# Patient Record
Sex: Female | Born: 1995
Health system: Southern US, Community
[De-identification: ages and names within clinical notes are randomized; demographics above are authoritative.]

## PROBLEM LIST (undated history)

## (undated) DIAGNOSIS — D649 Anemia, unspecified: Secondary | ICD-10-CM

## (undated) DIAGNOSIS — R011 Cardiac murmur, unspecified: Secondary | ICD-10-CM

## (undated) DIAGNOSIS — T7840XA Allergy, unspecified, initial encounter: Secondary | ICD-10-CM

## (undated) HISTORY — DX: Allergy, unspecified, initial encounter: T78.40XA

---
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
## 2016-01-18 ENCOUNTER — Ambulatory Visit (INDEPENDENT_AMBULATORY_CARE_PROVIDER_SITE_OTHER): Payer: BLUE CROSS/BLUE SHIELD | Admitting: Physician Assistant

## 2016-01-18 VITALS — BP 118/74 | HR 70 | Temp 98.0°F | Resp 18 | Ht 64.0 in | Wt 123.0 lb

## 2016-01-18 DIAGNOSIS — J029 Acute pharyngitis, unspecified: Secondary | ICD-10-CM

## 2016-01-18 DIAGNOSIS — R591 Generalized enlarged lymph nodes: Secondary | ICD-10-CM

## 2016-01-18 LAB — POCT RAPID STREP A (OFFICE): RAPID STREP A SCREEN: NEGATIVE

## 2016-01-18 NOTE — Progress Notes (Signed)
   Levon HedgerChantell Whisonant  MRN: [id number] DOB: 01-19-1996  Subjective:  Pt presents to clinic with a 2 day h/o left side sore throat that seems to be getting worse.  She is not having any other cold symptoms.  She has tried salt water and throat spray but not gotten much relief.    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   No current outpatient prescriptions on file prior to visit.   No current facility-administered medications on file prior to visit.    Allergies  Allergen Reactions  . Penicillins Anaphylaxis    Review of Systems  Constitutional: Negative for fever and chills.  HENT: Positive for sore throat. Negative for congestion, postnasal drip and rhinorrhea.   Musculoskeletal: Negative for myalgias.   Objective:  BP 118/74 mmHg  Pulse 70  Temp(Src) 98 F (36.7 C) (Oral)  Resp 18  Ht 5\' 4"  (1.626 m)  Wt 123 lb (55.792 kg)  BMI 21.10 kg/m2  SpO2 98%  LMP 01/11/2016  Physical Exam  Constitutional: She is oriented to person, place, and time and well-developed, well-nourished, and in no distress.  HENT:  Head: Normocephalic and atraumatic.  Right Ear: Hearing, tympanic membrane, external ear and ear canal normal.  Left Ear: Hearing, tympanic membrane, external ear and ear canal normal.  Nose: Nose normal.  Mouth/Throat: Uvula is midline, oropharynx is clear and moist and mucous membranes are normal.  Eyes: Conjunctivae are normal.  Neck: Normal range of motion.  Cardiovascular: Normal rate, regular rhythm and normal heart sounds.   No murmur heard. Pulmonary/Chest: Effort normal and breath sounds normal.  Lymphadenopathy:    She has cervical adenopathy.       Right cervical: Superficial cervical (nontender) adenopathy present.       Left cervical: Superficial cervical adenopathy: tender.       Right: No supraclavicular adenopathy present.       Left: No supraclavicular adenopathy present.    Neurological: She is alert and oriented to person, place, and time. Gait normal.  Skin: Skin is warm and dry.  Psychiatric: Mood, memory, affect and judgment normal.  Vitals reviewed.  Results for orders placed or performed in visit on 01/18/16  POCT rapid strep A  Result Value Ref Range   Rapid Strep A Screen Negative Negative    Assessment and Plan :  Sore throat - Plan: POCT rapid strep A  Lymphadenopathy   Symptomatic care d/w pt.  Likely a virus causing PND and resulting lymphadenopathy.  Benny LennertSarah Weber PA-C  [facility] 01/18/2016 4:51 PM

## 2016-01-18 NOTE — Patient Instructions (Addendum)
Please use motrin or tylenol for the lymph node pain - if it does not change/resolve within about 2/4 weeks  Please RTC    IF you received an x-ray today, you will receive an invoice from [facility]. Please contact [facility] at [phone] with questions or concerns regarding your invoice.   IF you received labwork today, you will receive an invoice from [facility]. Please contact [facility] at [phone] with questions or concerns regarding your invoice.   Our billing staff will not be able to assist you with questions regarding bills from these companies.  You will be contacted with the lab results as soon as they are available. The fastest way to get your results is to activate your My Chart account. Instructions are located on the last page of this paperwork. If you have not heard from us regarding the results in 2 weeks, please contact this office.

## 2016-08-24 ENCOUNTER — Ambulatory Visit (INDEPENDENT_AMBULATORY_CARE_PROVIDER_SITE_OTHER): Payer: BLUE CROSS/BLUE SHIELD | Admitting: Physician Assistant

## 2016-08-24 VITALS — BP 118/64 | HR 77 | Temp 97.8°F | Ht 64.0 in | Wt 130.6 lb

## 2016-08-24 DIAGNOSIS — Z3A01 Less than 8 weeks gestation of pregnancy: Secondary | ICD-10-CM | POA: Diagnosis not present

## 2016-08-24 DIAGNOSIS — N912 Amenorrhea, unspecified: Secondary | ICD-10-CM

## 2016-08-24 LAB — POCT URINE PREGNANCY: PREG TEST UR: POSITIVE — AB

## 2016-08-24 NOTE — Patient Instructions (Addendum)
04/15/2017 - due date Your are 6 weeks and 6 days pregnant   OB[facility]  [place] - [phone] [facility] [phone] [facility] - [phone] [facility] - [phone]   Pregnancy care help for the uninsured [phone] [facility] ([facility] Health and Human services) LipstickBlog.huWww.ncdhhs.gov/medicaid  Alternatives to pregnancy: Adoption -  Adoption Network - [phone] - www.adoptionnetwork.[employer]  [facility] [facility] [phone]- [phone] - www.christianadopt.org  [employer] [phone]- [phone] - www.carolinaadoption.org  [facility] Child[facility] Hope - [phone] - www.achildshope.com  [facility] [phone]- [phone] - www.gsocarecenter.org Abortion -     A Preferred [facility] [phone]- [phone] - www.MobLag.com.cyapwhc.com - my favorite  A Woman's Choice - [phone] - www.http://www.smith-bell.org/awomanschoiceinc.com  Family Reproductive Health - [phone] - www.familyreproductive.com    IF you received an x-ray today, you will receive an invoice from [facility]. Please contact [facility] at [phone] with questions or concerns regarding your invoice.   IF you received labwork today, you will receive an invoice from [facility]. Please contact [facility] at [phone] with questions or concerns regarding your invoice.   Our billing staff will not be able to assist you with questions regarding bills from these companies.  You will be contacted with the lab results as soon as they are available. The fastest way to get your results is to activate your My Chart account. Instructions are located on the last page of this paperwork. If you have not heard from us regarding the results in 2 weeks, please contact this office.

## 2016-08-24 NOTE — Progress Notes (Signed)
   Shelly HedgerChantell Cantrell  MRN: [id number] DOB: [birth year]  Subjective:  Pt presents to clinic for a pregnancy test.  She took her nuvaring out 2/2 and never started her menses and her breast are more tender than normal.  It was time to put her nuvaring back in and because she had not bleed she took a home pregnancy test that was positive.  Sexually active - intermittent condom use with 1 partner nuvaring - took out 08/17/2016 LMP 07/09/2016 -   ETOh - no Drugs - no Smoke - no  Review of Systems  Gastrointestinal: Negative for nausea.    There are no active problems to display for this patient.   No current outpatient prescriptions on file prior to visit.   No current facility-administered medications on file prior to visit.     Allergies  Allergen Reactions  . Penicillins Anaphylaxis    Pt patients past, family and social history were reviewed and updated.   Objective:  BP 118/64 (BP Location: Right Arm, Patient Position: Sitting, Cuff Size: Small)   Pulse 77   Temp 97.8 F (36.6 C) (Oral)   Ht 5\' 4"  (1.626 m)   Wt 130 lb 9.6 oz (59.2 kg)   LMP 07/08/2016 (Approximate)   SpO2 100%   BMI 22.42 kg/m   Physical Exam  Constitutional: She is oriented to person, place, and time and well-developed, well-nourished, and in no distress.  HENT:  Head: Normocephalic and atraumatic.  Right Ear: Hearing and external ear normal.  Left Ear: Hearing and external ear normal.  Eyes: Conjunctivae are normal.  Neck: Normal range of motion.  Pulmonary/Chest: Effort normal.  Neurological: She is alert and oriented to person, place, and time. Gait normal.  Skin: Skin is warm and dry.  Psychiatric: Mood, memory, affect and judgment normal.  Vitals reviewed.   Results for orders placed or performed in visit on 08/24/16  POCT urine pregnancy  Result Value Ref Range   Preg Test, Ur Positive (A) Negative    Assessment and Plan :  Amenorrhea - Plan: POCT urine pregnancy  Less than [redacted]  weeks gestation of pregnancy - gave patient options for pregnancy - d/w her the time frame for her options - she should start a PNV while she is determining what she wants to do with the pregnancy.  Answered her questions.  Benny LennertSarah Trinty Marken PA-C  [facility] at [facility] 08/29/2016 11:30 AM

## 2016-08-29 ENCOUNTER — Encounter: Payer: Self-pay | Admitting: Physician Assistant

## 2017-07-20 ENCOUNTER — Encounter: Payer: Self-pay | Admitting: Family Medicine

## 2017-07-20 ENCOUNTER — Other Ambulatory Visit: Payer: Self-pay

## 2017-07-20 ENCOUNTER — Ambulatory Visit: Payer: BLUE CROSS/BLUE SHIELD | Admitting: Family Medicine

## 2017-07-20 VITALS — BP 120/74 | HR 78 | Temp 97.4°F | Ht 64.0 in | Wt 128.0 lb

## 2017-07-20 DIAGNOSIS — Z111 Encounter for screening for respiratory tuberculosis: Secondary | ICD-10-CM

## 2017-07-20 DIAGNOSIS — K5900 Constipation, unspecified: Secondary | ICD-10-CM

## 2017-07-20 DIAGNOSIS — Z23 Encounter for immunization: Secondary | ICD-10-CM | POA: Diagnosis not present

## 2017-07-20 DIAGNOSIS — R1011 Right upper quadrant pain: Secondary | ICD-10-CM

## 2017-07-20 DIAGNOSIS — R109 Unspecified abdominal pain: Secondary | ICD-10-CM

## 2017-07-20 LAB — POCT URINALYSIS DIP (MANUAL ENTRY)
Bilirubin, UA: NEGATIVE
Blood, UA: NEGATIVE
Glucose, UA: NEGATIVE mg/dL
Ketones, POC UA: NEGATIVE mg/dL
Leukocytes, UA: NEGATIVE
Nitrite, UA: NEGATIVE
Protein Ur, POC: NEGATIVE mg/dL
Spec Grav, UA: >=1.03 — AB (ref 1.010–1.025)
Urobilinogen, UA: 0.2 E.U./dL
pH, UA: 6.5 (ref 5.0–8.0)

## 2017-07-20 MED ORDER — TUBERCULIN PPD 5 UNIT/0.1ML ID SOLN
5.0000 [IU] | Freq: Once | INTRADERMAL | Status: AC
  Administered 2017-07-20: 5 [IU] via INTRADERMAL

## 2017-07-20 MED ORDER — POLYETHYLENE GLYCOL 3350 17 GM/SCOOP PO POWD
17.0000 g | Freq: Every day | ORAL | 1 refills | Status: DC
Start: 2017-07-20 — End: 2019-03-19

## 2017-07-20 MED ORDER — OMEPRAZOLE 20 MG PO CPDR: 20.0000 mg | DELAYED_RELEASE_CAPSULE | Freq: Every day | ORAL | 3 refills | Status: DC

## 2017-07-20 NOTE — Progress Notes (Signed)
1/5/201911:07 AM  Shelly Cantrell June 29, 1996, 22 y.o. female [id number]  Chief Complaint  Patient presents with  . Abdominal Pain    pain with breathing and lying down. Scale pain 4    HPI:   Patient is a 22 y.o. female with past medical history significant for constipation who presents today for 2 weeks of new onset worsening ruq abd pain, radiates to her back and epigastrum. Worse at night, when she lies down. She ate a bunch of bacon the other day and that also intensified it. She denies any nausea or vomiting, she does have mild bloating and significant gassiness. She is chronically constipated. Last BM 2 days, bristol stool type 1.   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 Denies h/o positive PPD test, exposures, recent travels, hemoptysis, cough, night sweats, weight loss.   Depression screen [facility] 07/20/2017 08/24/2016  Decreased Interest 0 0  Down, Depressed, Hopeless 0 0  PHQ - 2 Score 0 0    Allergies  Allergen Reactions  . Penicillins Anaphylaxis    Prior to Admission medications   Not on File    Past Medical History:  Diagnosis Date  . Allergy     History reviewed. No pertinent surgical history.  Social History   Tobacco Use  . Smoking status: Never Smoker  . Smokeless tobacco: Never Used  Substance Use Topics  . Alcohol use: No    Alcohol/week: 0.0 oz    Family History  Problem Relation Age of Onset  . Cancer Mother   . Diabetes Maternal Grandmother   . Diabetes Paternal Grandmother     ROS Per hpi No fever, chills No cough, SOB No dysuria, hematuria  OBJECTIVE:  Blood pressure 120/74, pulse 78, temperature (!) 97.4 F (36.3 C), temperature source Oral, height 5\' 4"  (1.626 m), weight 128 lb (58.1 kg), last menstrual period 07/07/2017, SpO2 100 %.  Physical Exam  Constitutional: She is oriented to person, place, and time and well-developed, well-nourished, and in no distress.  HENT:  Head: Normocephalic and  atraumatic.  Mouth/Throat: Oropharynx is clear and moist. No oropharyngeal exudate.  Eyes: EOM are normal. Pupils are equal, round, and reactive to light. No scleral icterus.  Neck: Neck supple.  Cardiovascular: Normal rate, regular rhythm and normal heart sounds. Exam reveals no gallop and no friction rub.  No murmur heard. Pulmonary/Chest: Effort normal and breath sounds normal. She has no wheezes. She has no rales.  Abdominal: Soft. Bowel sounds are normal. She exhibits no distension. There is no hepatosplenomegaly. There is tenderness in the right upper quadrant, epigastric area and left lower quadrant. There is no rebound, no guarding and negative Murphy's sign.  Musculoskeletal: She exhibits no edema.  Neurological: She is alert and oriented to person, place, and time. Gait normal.  Skin: Skin is warm and dry.     Results for orders placed or performed in visit on 07/20/17 (from the past 24 hour(s))  POCT urinalysis dipstick     Status: Abnormal   Collection Time: 07/20/17 11:03 AM  Result Value Ref Range   Color, UA yellow yellow   Clarity, UA clear clear   Glucose, UA negative negative mg/dL   Bilirubin, UA negative negative   Ketones, POC UA negative negative mg/dL   Spec Grav, UA >=4.098>=1.030 (A) 1.010 - 1.025   Blood, UA negative negative   pH, UA 6.5 5.0 - 8.0   Protein Ur, POC negative negative mg/dL   Urobilinogen, UA  0.2 0.2 or 1.0 E.U./dL   Nitrite, UA Negative Negative   Leukocytes, UA Negative Negative    ASSESSMENT and PLAN 1. Abdominal pain, unspecified abdominal location Discussed ddx. Will start with workup. ER precautions reviewed.  - POCT urinalysis dipstick - CBC with Differential/Platelet - Comprehensive metabolic panel  2. Need for prophylactic vaccination with combined diphtheria-tetanus-pertussis (DTP) vaccine - Td vaccine greater than or equal to 7yo preservative free IM  3. RUQ abdominal pain - CBC with Differential/Platelet - Comprehensive  metabolic panel - H. pylori breath test - US Abdomen Limited RUQ; Future  4. Screening-pulmonary TB Return in 2 days for read - tuberculin injection 5 Units  5. Constipation, unspecified constipation type  Other orders - omeprazole (PRILOSEC) 20 MG capsule; Take 1 capsule (20 mg total) by mouth daily. - polyethylene glycol powder (GLYCOLAX/MIRALAX) powder; Take 17 g by mouth daily.  Return 2 days for nurse PPD read.    Myles Lipps, MD Primary Care at [facility] [address] [place], [place] [address] Ph.  [phone] Fax [phone]

## 2017-07-20 NOTE — Patient Instructions (Addendum)
IF you received an x-ray today, you will receive an invoice from [facility]. Please contact [facility] at [phone] with questions or concerns regarding your invoice.   IF you received labwork today, you will receive an invoice from [facility]. Please contact [facility] at [phone] with questions or concerns regarding your invoice.   Our billing staff will not be able to assist you with questions regarding bills from these companies.  You will be contacted with the lab results as soon as they are available. The fastest way to get your results is to activate your My Chart account. Instructions are located on the last page of this paperwork. If you have not heard from [place] regarding the results in 2 weeks, please contact this office.     Tuberculosis Risk Questionnaire  1. No Were you born outside the [place] in one of the following parts of the world: [place], [place], [place], [place] or [place]?    2. No Have you traveled outside the [place] and lived for more than one month in one of the following parts of the world: [place], [place], [place], [place] or [place]?    3. No Do you have a compromised immune system such as from any of the following conditions:HIV/AIDS, organ or bone marrow transplantation, diabetes, immunosuppressive medicines (e.g. Prednisone, Remicaide), leukemia, lymphoma, cancer of the head or neck, gastrectomy or jejunal bypass, end-stage renal disease (on dialysis), or silicosis?     4. 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 No Have you ever: injected illegal drugs, used crack cocaine, lived in a homeless shelter  or been in jail or prison?     6. No Have you ever been exposed to anyone with infectious tuberculosis?  7. No Have you ever had a BCG vaccine? (BCG is a vaccine for tuberculosis   (TB) used in OTHER countries, NOT in the [place]).  8. No Have you ever been advised by a health care provider NOT to have a TB skin test?  9. No Have you ever had a POSITIVE TB skin test?  IF SO, when? n/a  IF SO, were you treated with INH? n/a  IF SO, where? n/a  Tuberculosis Symptom Questionnaire  Do you currently have any of the following symptoms?  1. No Unexplained cough lasting more than 3 weeks?   2. No Unexplained fever lasting more than 3 weeks.   3. No Night Sweats (sweating that leaves the bedclothes and sheets wet)     4. No Shortness of Breath   5. No Chest Pain   6. No Unintentional weight loss    7. No Unexplained fatigue (very tired for no reason)    Biliary Colic, Adult Biliary colic is severe pain caused by a problem with a small organ in the upper right part of your belly (gallbladder). The gallbladder stores a digestive fluid produced in the liver (bile) that helps the body break down fat. Bile and other digestive enzymes are carried from the liver to the small intestine though tube-like structures (bile ducts). The gallbladder and the bile ducts form the biliary tract. Sometimes hard deposits of digestive fluids form in the gallbladder (gallstones) and block the flow of bile from the gallbladder, causing biliary colic. This condition is also called a gallbladder attack. Gallstones can be as small  as a grain of sand or as big as a golf ball. There could be just one gallstone in the gallbladder, or there could be many. What are the causes? Biliary colic is usually caused by gallstones. Less often, a tumor could block the flow of bile from the gallbladder and trigger biliary colic. What increases the risk? This condition is more likely to develop in:  Women.  People of Hispanic descent.  People with a family history of gallstones.  People who are obese.  People who suddenly or quickly lose weight.  People who eat a high-calorie, low-fiber diet that is rich  in refined carbs (carbohydrates), such as white bread and white rice.  People who have an intestinal disease that affects nutrient absorption, such as Crohn disease.  People who have a metabolic condition, such as metabolic syndrome or diabetes.  What are the signs or symptoms? Severe pain in the upper right side of the belly is the main symptom of biliary colic. You may feel this pain below the chest but above the hip. This pain often occurs at night or after eating a very fatty meal. This pain may get worse for up to an hour and last as long as 12 hours. In most cases, the pain fades (subsides) within a couple hours. Other symptoms of this condition include:  Nausea and vomiting.  Pain under the right shoulder.  How is this diagnosed? This condition is diagnosed based on your medical history, your symptoms, and a physical exam. You may have tests, including:  Blood tests to rule out infection or inflammation of the bile ducts, gallbladder, pancreas, or liver.  Imaging studies such as: ? Ultrasound. ? CT scan. ? MRI.  In some cases, you may need to have an imaging study done using a small amount of radioactive material (nuclear medicine) to confirm the diagnosis. How is this treated? Treatment for this condition may include medicine to relieve your pain or nausea. If you have gallstones that are causing biliary colic, you may need surgery to remove the gallbladder (cholecystectomy). Gallstones can also be dissolved gradually with medicine. It may take months or years before the gallstones are completely gone. Follow these instructions at home:  Take over-the-counter and prescription medicines only as told by your health care provider.  Drink enough fluid to keep your urine clear or pale yellow.  Follow instructions from your health care provider about eating or drinking restrictions. These may include avoiding: ? Fatty, greasy, and fried foods. ? Any foods that make the pain  worse. ? Overeating. ? Having a large meal after not eating for a while.  Keep all follow-up visits as told by your health care provider. This is important. How is this prevented? Steps to prevent this condition include:  Maintaining a healthy body weight.  Getting regular exercise.  Eating a healthy, high-fiber, low-fat diet.  Limiting how much sugar and refined carbs you eat, such as sweets, white flour, and white rice.  Contact a health care provider if:  Your pain lasts more than 5 hours.  You vomit.  You have a fever and chills.  Your pain gets worse. Get help right away if:  Your skin or the whites of your eyes look yellow (jaundice).  Your have tea-colored urine and light-colored stools.  You are dizzy or you faint. This information is not intended to replace advice given to you by your health care provider. Make sure you discuss any questions you have with your health care provider.  Document Released: 12/03/2005 Document Revised: 02/28/2016 Document Reviewed: 01/16/2016 Elsevier Interactive Patient Education  [employer].

## 2017-07-21 LAB — COMPREHENSIVE METABOLIC PANEL
ALT: 12 IU/L (ref 0–32)
AST: 15 IU/L (ref 0–40)
Albumin/Globulin Ratio: 1.4 (ref 1.2–2.2)
Albumin: 4.5 g/dL (ref 3.5–5.5)
Alkaline Phosphatase: 62 IU/L (ref 39–117)
BUN/Creatinine Ratio: 13 (ref 9–23)
BUN: 11 mg/dL (ref 6–20)
Bilirubin Total: 0.3 mg/dL (ref 0.0–1.2)
CO2: 23 mmol/L (ref 20–29)
Calcium: 9.5 mg/dL (ref 8.7–10.2)
Chloride: 103 mmol/L (ref 96–106)
Creatinine, Ser: 0.85 mg/dL (ref 0.57–1.00)
GFR calc Af Amer: 112 mL/min/{1.73_m2} (ref >59)
GFR calc non Af Amer: 98 mL/min/{1.73_m2} (ref >59)
Globulin, Total: 3.2 g/dL (ref 1.5–4.5)
Glucose: 85 mg/dL (ref 65–99)
Potassium: 4.2 mmol/L (ref 3.5–5.2)
Sodium: 143 mmol/L (ref 134–144)
Total Protein: 7.7 g/dL (ref 6.0–8.5)

## 2017-07-21 LAB — CBC WITH DIFFERENTIAL/PLATELET
Basophils Absolute: 0 10*3/uL (ref 0.0–0.2)
Basos: 0 %
EOS (ABSOLUTE): 0.3 10*3/uL (ref 0.0–0.4)
Eos: 7 %
Hematocrit: 34.1 % (ref 34.0–46.6)
Hemoglobin: 11.3 g/dL (ref 11.1–15.9)
Immature Grans (Abs): 0 10*3/uL (ref 0.0–0.1)
Immature Granulocytes: 0 %
Lymphocytes Absolute: 1.8 10*3/uL (ref 0.7–3.1)
Lymphs: 35 %
MCH: 27 pg (ref 26.6–33.0)
MCHC: 33.1 g/dL (ref 31.5–35.7)
MCV: 81 fL (ref 79–97)
Monocytes Absolute: 0.3 10*3/uL (ref 0.1–0.9)
Monocytes: 6 %
Neutrophils Absolute: 2.7 10*3/uL (ref 1.4–7.0)
Neutrophils: 52 %
Platelets: 514 10*3/uL — ABNORMAL HIGH (ref 150–379)
RBC: 4.19 x10E6/uL (ref 3.77–5.28)
RDW: 13.5 % (ref 12.3–15.4)
WBC: 5.1 10*3/uL (ref 3.4–10.8)

## 2017-07-22 ENCOUNTER — Ambulatory Visit: Payer: BLUE CROSS/BLUE SHIELD | Admitting: Physician Assistant

## 2017-07-23 LAB — H. PYLORI BREATH TEST: H pylori Breath Test: NEGATIVE

## 2017-07-25 ENCOUNTER — Emergency Department (HOSPITAL_COMMUNITY)
Admission: EM | Admit: 2017-07-25 | Discharge: 2017-07-25 | Disposition: A | Discharge status: Home / Self Care | Payer: BLUE CROSS/BLUE SHIELD | Attending: Emergency Medicine | Admitting: Emergency Medicine

## 2017-07-25 ENCOUNTER — Emergency Department (HOSPITAL_COMMUNITY): Payer: BLUE CROSS/BLUE SHIELD

## 2017-07-25 ENCOUNTER — Other Ambulatory Visit: Payer: Self-pay

## 2017-07-25 ENCOUNTER — Encounter (HOSPITAL_COMMUNITY): Payer: Self-pay

## 2017-07-25 DIAGNOSIS — Y92410 Unspecified street and highway as the place of occurrence of the external cause: Secondary | ICD-10-CM | POA: Insufficient documentation

## 2017-07-25 DIAGNOSIS — R079 Chest pain, unspecified: Secondary | ICD-10-CM | POA: Diagnosis present

## 2017-07-25 DIAGNOSIS — Z79899 Other long term (current) drug therapy: Secondary | ICD-10-CM | POA: Diagnosis not present

## 2017-07-25 DIAGNOSIS — Y999 Unspecified external cause status: Secondary | ICD-10-CM | POA: Insufficient documentation

## 2017-07-25 DIAGNOSIS — Y9389 Activity, other specified: Secondary | ICD-10-CM | POA: Insufficient documentation

## 2017-07-25 DIAGNOSIS — R0789 Other chest pain: Secondary | ICD-10-CM | POA: Diagnosis not present

## 2017-07-25 MED ORDER — IBUPROFEN 400 MG PO TABS: 400.0000 mg | ORAL_TABLET | Freq: Three times a day (TID) | ORAL | 0 refills | Status: DC

## 2017-07-25 MED ORDER — IBUPROFEN 400 MG PO TABS
400.0000 mg | ORAL_TABLET | Freq: Once | ORAL | Status: AC
  Administered 2017-07-25: 400 mg via ORAL
  Filled 2017-07-25: qty 1

## 2017-07-25 NOTE — ED Notes (Signed)
Pt departed in NAD, refused use of wheelchair.  
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
## 2017-08-01 ENCOUNTER — Encounter: Payer: Self-pay | Admitting: Family Medicine

## 2017-08-01 ENCOUNTER — Ambulatory Visit: Payer: BLUE CROSS/BLUE SHIELD | Admitting: Family Medicine

## 2017-08-01 VITALS — BP 108/68 | HR 88 | Temp 98.8°F | Resp 18 | Ht 64.0 in | Wt 125.8 lb

## 2017-08-01 DIAGNOSIS — M542 Cervicalgia: Secondary | ICD-10-CM | POA: Diagnosis not present

## 2017-08-01 DIAGNOSIS — S060X0A Concussion without loss of consciousness, initial encounter: Secondary | ICD-10-CM | POA: Diagnosis not present

## 2017-08-01 MED ORDER — CYCLOBENZAPRINE HCL 10 MG PO TABS: 10.0000 mg | ORAL_TABLET | Freq: Three times a day (TID) | ORAL | 0 refills | Status: DC | PRN

## 2017-08-01 NOTE — Patient Instructions (Addendum)
   IF you received an x-ray today, you will receive an invoice from [facility]. Please contact [facility] at [phone] with questions or concerns regarding your invoice.   IF you received labwork today, you will receive an invoice from [facility]. Please contact [facility] at [phone] with questions or concerns regarding your invoice.   Our billing staff will not be able to assist you with questions regarding bills from these companies.  You will be contacted with the lab results as soon as they are available. The fastest way to get your results is to activate your My Chart account. Instructions are located on the last page of this paperwork. If you have not heard from us regarding the results in 2 weeks, please contact this office.     Concussion, Adult A concussion is a brain injury from a direct hit (blow) to the head or body. This blow causes the brain to shake quickly back and forth inside the skull. This can damage brain cells and cause chemical changes in the brain. A concussion may also be known as a mild traumatic brain injury (TBI). Concussions are usually not life-threatening, but the effects of a concussion can be serious. If you have a concussion, you are more likely to experience concussion-like symptoms after a direct blow to the head in the future. What are the causes? This condition is caused by:  A direct blow to the head, such as from running into another player during a game, being hit in a fight, or hitting your head on a hard surface.  A jolt of the head or neck that causes the brain to move back and forth inside the skull, such as in a car crash.  What are the signs or symptoms? The signs of a concussion can be hard to notice. Early on, they may be missed by you, family members, and health care providers. You may look fine but act or feel differently. Symptoms are usually temporary, but they may last for days, weeks, or even longer. Some  symptoms may appear right away but other symptoms may not show up for hours or days. Every head injury is different. Symptoms may include:  Headaches. This can include a feeling of pressure in the head.  Memory problems.  Trouble concentrating, organizing, or making decisions.  Slowness in thinking, acting or reacting, speaking, or reading.  Confusion.  Fatigue.  Changes in eating or sleeping patterns.  Problems with coordination or balance.  Nausea or vomiting.  Numbness or tingling.  Sensitivity to light or noise.  Vision or hearing problems.  Reduced sense of smell.  Irritability or mood changes.  Dizziness.  Lack of motivation.  Seeing or hearing things that other people do not see or hear (hallucinations).  How is this diagnosed? This condition is diagnosed based on:  Your symptoms.  A description of your injury.  You may also have tests, including:  Imaging tests, such as a CT scan or MRI. These are done to look for signs of brain injury.  Neuropsychological tests. These measure your thinking, understanding, learning, and remembering abilities.  How is this treated? This condition is treated with physical and mental rest and careful observation, usually at home. If the concussion is severe, you may need to stay home from work for a while. You may be referred to a concussion clinic or to other health care providers for management. It is important that you tell your health care provider if:  You are taking any medicines,   including prescription medicines, over-the-counter medicines, and natural remedies. Some medicines, such as blood thinners (anticoagulants) and aspirin, may increase the chance of complications, such as bleeding.  You are taking or have taken alcohol or illegal drugs. Alcohol and certain other drugs may slow your recovery and can put you at risk of further injury.  How fast you will recover from a concussion depends on many factors, such as  how severe your concussion is, what part of your brain was injured, how old you are, and how healthy you were before the concussion. Recovery can take time. It is important to wait to return to activity until a health care provider says it is safe to do that and your symptoms are completely gone. Follow these instructions at home: Activity  Limit activities that require a lot of thought or concentration. These may include: ? Doing homework or job-related work. ? Watching TV. ? Working on the computer. ? Playing memory games and puzzles.  Rest. Rest helps the brain to heal. Make sure you: ? Get plenty of sleep at night. Avoid staying up late at night. ? Keep the same bedtime hours on weekends and weekdays. ? Rest during the day. Take naps or rest breaks when you feel tired.  Having another concussion before the first one has healed can be dangerous. Do not do high-risk activities that could cause a second concussion, such as riding a bicycle or playing sports.  Ask your health care provider when you can return to your normal activities, such as school, work, athletics, driving, riding a bicycle, or using heavy machinery. Your ability to react may be slower after a brain injury. Never do these activities if you are dizzy. Your health care provider will likely give you a plan for gradually returning to activities. General instructions  Take over-the-counter and prescription medicines only as told by your health care provider.  Do not drink alcohol until your health care provider says you can.  If it is harder than usual to remember things, write them down.  If you are easily distracted, try to do one thing at a time. For example, do not try to watch TV while fixing dinner.  Talk with family members or close friends when making important decisions.  Watch your symptoms and tell others to do the same. Complications sometimes occur after a concussion. Older adults with a brain injury may have  a higher risk of serious complications, such as a blood clot in the brain.  Tell your teachers, school nurse, school counselor, coach, athletic trainer, or work manager about your injury, symptoms, and restrictions. Tell them about what you can or cannot do. They should watch for: ? Increased problems with attention or concentration. ? Increased difficulty remembering or learning new information. ? Increased time needed to complete tasks or assignments. ? Increased irritability or decreased ability to cope with stress. ? Increased symptoms.  Keep all follow-up visits as told by your health care provider. This is important. How is this prevented? It is very important to avoid another brain injury, especially as you recover. In rare cases, another injury can lead to permanent brain damage, brain swelling, or death. The risk of this is greatest during the first 7-10 days after a head injury. Avoid injuries by:  Wearing a seat belt when riding in a car.  Wearing a helmet when biking, skiing, skateboarding, skating, or doing similar activities.  Avoiding activities that could lead to a second concussion, such as contact or recreational sports,   until your health care provider says it is okay.  Taking safety measures in your home, such as: ? Removing clutter and tripping hazards from floors and stairways. ? Using grab bars in bathrooms and handrails by stairs. ? Placing non-slip mats on floors and in bathtubs. ? Improving lighting in dim areas.  Contact a health care provider if:  Your symptoms get worse.  You have new symptoms.  You continue to have symptoms for more than 2 weeks. Get help right away if:  You have severe or worsening headaches.  You have weakness or numbness in any part of your body.  Your coordination gets worse.  You vomit repeatedly.  You are sleepier.  The pupil of one eye is larger than the other.  You have convulsions or a seizure.  Your speech is  slurred.  Your fatigue, confusion, or irritability gets worse.  You cannot recognize people or places.  You have neck pain.  It is difficult to wake you up.  You have unusual behavior changes.  You lose consciousness. Summary  A concussion is a brain injury from a direct hit (blow) to the head or body.  A concussion may also be called a mild traumatic brain injury (TBI).  You may have imaging tests and neuropsychological tests to diagnose a concussion.  This condition is treated with physical and mental rest and careful observation.  Ask your health care provider when you can return to your normal activities, such as school, work, athletics, driving, riding a bicycle, or using heavy machinery. Follow safety instructions as told by your health care provider. This information is not intended to replace advice given to you by your health care provider. Make sure you discuss any questions you have with your health care provider. Document Released: 09/22/2003 Document Revised: 06/12/2016 Document Reviewed: 06/12/2016 Elsevier Interactive Patient Education  2018 Elsevier Inc.  

## 2017-08-02 ENCOUNTER — Encounter: Payer: Self-pay | Admitting: Family Medicine
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
## 2017-08-05 ENCOUNTER — Ambulatory Visit
Admission: RE | Admit: 2017-08-05 | Discharge: 2017-08-05 | Disposition: A | Discharge status: Home / Self Care | Payer: BLUE CROSS/BLUE SHIELD | Source: Ambulatory Visit | Attending: Family Medicine | Admitting: Family Medicine

## 2017-08-05 DIAGNOSIS — R1011 Right upper quadrant pain: Secondary | ICD-10-CM

## 2017-08-08 ENCOUNTER — Encounter: Payer: Self-pay | Admitting: Family Medicine

## 2017-08-08 ENCOUNTER — Other Ambulatory Visit: Payer: Self-pay

## 2017-08-08 ENCOUNTER — Ambulatory Visit: Payer: BLUE CROSS/BLUE SHIELD | Admitting: Family Medicine

## 2017-08-08 VITALS — BP 116/68 | HR 76 | Temp 98.7°F | Ht 65.0 in | Wt 127.0 lb

## 2017-08-08 DIAGNOSIS — F430 Acute stress reaction: Secondary | ICD-10-CM

## 2017-08-08 DIAGNOSIS — S060X0D Concussion without loss of consciousness, subsequent encounter: Secondary | ICD-10-CM | POA: Diagnosis not present
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
## 2017-08-08 NOTE — Patient Instructions (Signed)
     IF you received an x-ray today, you will receive an invoice from [facility]. Please contact [facility] at [phone] with questions or concerns regarding your invoice.   IF you received labwork today, you will receive an invoice from [facility]. Please contact [facility] at [phone] with questions or concerns regarding your invoice.   Our billing staff will not be able to assist you with questions regarding bills from these companies.  You will be contacted with the lab results as soon as they are available. The fastest way to get your results is to activate your My Chart account. Instructions are located on the last page of this paperwork. If you have not heard from us regarding the results in 2 weeks, please contact this office.     

## 2017-08-13 ENCOUNTER — Telehealth: Payer: Self-pay
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
## 2017-08-14 ENCOUNTER — Encounter: Payer: Self-pay | Admitting: Family Medicine

## 2017-08-14 ENCOUNTER — Ambulatory Visit (INDEPENDENT_AMBULATORY_CARE_PROVIDER_SITE_OTHER): Payer: BLUE CROSS/BLUE SHIELD | Admitting: Family Medicine

## 2017-08-14 VITALS — BP 116/76 | HR 44 | Ht 64.0 in | Wt 127.0 lb

## 2017-08-14 DIAGNOSIS — S060X0A Concussion without loss of consciousness, initial encounter: Secondary | ICD-10-CM | POA: Diagnosis not present

## 2017-08-14 NOTE — Patient Instructions (Signed)
Good to see you  Likely concussion but really almost gone Fish oil 3 grams daily for 10 days then 2 grams daily thereafter CoQ10 400mg  daily until headaches resolved.  Vitamin D 2000 IU daily  We will get you back to track in a bout a week  Make an appointment in 1 week if not perfect then see me

## 2017-08-19 ENCOUNTER — Telehealth: Payer: Self-pay | Admitting: Family Medicine

## 2017-08-19 NOTE — Telephone Encounter (Signed)
Called Dr. Marikay Alaralorisha isreal and left a message on how to proceed with a referral for her.. Told her to call back

## 2017-08-20 NOTE — Progress Notes (Deleted)
Subjective:   @VITALSMCOMMENTS @  Chief Complaint: Shelly Cantrell, DOB: 08-Oct-1995, is a 22 y.o. female who presents for No chief complaint on file.   Injury date : *** Visit #: ***  History of Present Illness:   Patient's goals/priorities: ***  CLASS CURRENT GRADE COMMENTS                               Concussion Self-Reported Symptom Score Symptoms rated on a scale 1-6, in last 24 hours  Headache: ***    Nausea: ***  Vomiting: ***  Balance Difficulty: ***   Dizziness: ***  Fatigue: ***  Trouble Falling Asleep: ***   Sleep More Than Usual: ***  Sleep Less Than Usual: ***  Daytime Drowsiness: ***  Photophobia: ***  Phonophobia: ***  Feeling anxious: ***  Confused: ***  Irritability: ***  Sadness: ***  Nervousness: ***  Feeling More Emotional: ***  Numbness or Tingling: ***  Feeling Slowed Down: ***  Feeling Mentally Foggy: ***  Difficulty Concentrating: ***  Difficulty Remembering: ***  Visual Problems: ***  Neck Pain: ***  Tinnitus: ***   Total Symptom Score: *** Previous Symptom Score: ***  Review of Systems: Pertinent items are noted in HPI.  Review of History: Past Medical History: @PMHP @  Past Surgical History:  has no past surgical history on file. Family History: family history includes Cancer in her mother; Diabetes in her maternal grandmother and paternal grandmother. Social History:  reports that  has never smoked. she has never used smokeless tobacco. She reports that she does not drink alcohol or use drugs. Current Medications: has a current medication list which includes the following prescription(s): ibuprofen and polyethylene glycol powder. Allergies: is allergic to penicillins.  Objective:    Physical Examination There were no vitals filed for this visit. General appearance: alert, appears stated age and cooperative Head: Normocephalic, without obvious abnormality, atraumatic Eyes: conjunctivae/corneas clear. PERRL, EOM's intact.  Fundi benign. Sclera anicteric. Lungs: clear to auscultation bilaterally and percussion Heart: regular rate and rhythm, S1, S2 normal, no murmur, click, rub or gallop Neurologic: CN 2-12 normal.  Sensation to pain, touch, and proprioception normal.  DTRs  normal in upper and lower extremities. No pathologic reflexes. Neg rhomberg, modified rhomberg, pronator drift, tandem gait, finger-to-nose; see post-concussion vestibular and oculomotor testing in chart Psychiatric: Oriented X3, intact recent and remote memory, judgement and insight, normal mood and affect  Concussion testing performed today:  Neurocognitive testing (ImPACT):  Baseline:*** Post #1: ***   Verbal Memory Composite *** (***%) *** (***%)   Visual Memory Composite *** (***%) *** (***%)   Visual Motor Speed Composite *** (***%) *** (***%)   Reaction Time Composite *** (***%) *** (***%)   Cognitive Efficiency Index *** ***     Vestibular Screening:   Pre VOMS  HA Score: *** Pre VOMS  Dizziness Score: ***   Headache  Dizziness  Smooth Pursuits *** ***  H. Saccades *** ***  V. Saccades *** ***  H. VOR *** ***  V. VOR *** ***  Visual Motor Sensitivity *** ***  Accommodation Right: *** cm Left: *** cm *** ***  Convergence: *** cm Divergence: *** cm *** ***   Balance Screen: ***  Additional testing performed today: { :28529}   Assessment:    No diagnosis found.  Shelly Cantrell presents with the following concussion subtypes. [] Cognitive [] Cervical [] Vestibular [] Ocular [] Migraine [] Anxiety/Mood   Plan:   Action/Discussion: Reviewed diagnosis, management options, expected outcomes,  and the reasons for scheduled and emergent follow-up. Questions were adequately answered. Patient expressed verbal understanding and agreement with the following plan.      Participation in school/work: Patient is cleared to return to work/school and activities of daily living without restrictions.  Patient is not cleared to  return to work/school until further notice.  Patient may return to work/school on ***, with the following restrictions/supports:  Length of Day:  Please allow patient to use *** class as study hall in a quiet area.  Shortened school/work day: Recommend *** until ***.  Recommend core classes only.  Extra Time:  Take mental rest breaks during the day as needed. Check for return of symptoms when participating in any activities that require a significant amount of attention or concentration.  Allow extra time to complete tasks.  Please allow *** weeks to make up missed assignments, test, quizzes.  Visual/Vestibular Accommodations in School:  Allow patient to eat lunch in quiet environment with 1-2 classmates.  Allow patient to leave class 5 minutes before end of period to avoid busy/noisy hallway.  Please provide any supplemental learning materials (power points, lecture notes, handouts, etc) in minimum size 18 font and allow/provide any auditory supplements to learning when possible (books on tape, audio tape lectures, etc) to limit visual stress in the classroom.  Patient is cleared for auditory participation only. Patient is not cleared for homework, quizzes, or tests at this time.   Testing:  May begin taking tests/quizzes on *** with no more than one test/quiz per day.   No significant classroom or standardized testing until ***.  Home/Extracurricular:  Lessen work/homework load to allow adequate cognitive rest. Work *** minutes with intervals of *** minute breaks (total *** hours).  Limit visual stimulants including: driving, watching television/movies, reading, using cell phone, etc. - to ensure relative visual cognitive rest. NOT cleared for video or phone games. May participate *** minutes with intervals of *** minute breaks (total *** hours).   Participation in physical activity: Patient is cleared to return to physical activity participation without  restrictions.  Patient is not cleared for formal physical activity (includes physical education class, sports practices, sports games, weight training, etc) at this time.   However, we recommend that patient has 20-30 minutes of light cardiovascular activity daily, with NO risk of head injury (example: walking), staying below level of symptoms.  Recommend gradual progression to *** vestibular exercise (30-45 min/day) with no risk of head trauma while staying below level of symptoms.  See your exercise treatment menu for details.   Begin your exercise prescription on ____________ (see separate exercise prescription form)  Cleared for physical activity that poses NO RISK of head trauma.   Gradual return to physical activity under the supervision of a physician and/or athletic trainer  Once asymptomatic for 24 hours, patient may start Stage *** of CFPSM's { :10024} Exercise Progression Protocol. This is to be monitored by patient's { :28383}    Patient may start Stage *** of CFPSM's { :10024} Exercise Progression Protocol. This is to be monitored by patient's { :28383}.   Patient is not cleared for full contact activities, activities with risk of head trauma or unsupervised physical activity while participating in CFPSM's Exercise Progression. Check for return of symptoms (using Concussion Education Form Symptom List) when participating in activity and 24 hours following. If symptoms return, patient to contact our office for further recommendations.    [facility] Medical Recommendations form has been given to patient allowing *** to progress  patient back into full participation.  Active Treatment Strategies:  Fueling your brain is important for recovery. It is essential to stay well hydrated, aiming for half of your body weight in fluid ounces per day (100 lbs = 50 oz). We also recommend eating breakfast to start your day and focus on a well-balanced diet containing lean protein, 'good' fats, and  complex carbohydrates. See your nutrition / hydration handout for more details.   Quality sleep is vital in your concussion recovery. We encourage lots of sleep for the first 24-72 hours after injury but following this period it is important to regulate your sleep cycle. We encourage *** hours of quality sleep per night. See your sleep handout for more details and strategies to quality sleep.  IF NOT USING THE OPTIONS BELOW DELETE THEM  Treating your vestibular and visual dysfunction will decrease your recovery time and improve your symptoms. Begin your home vestibular exercise program as directed on your AVS.    Begin taking Amantadine medicine as directed.   Begin taking DHA supplement as directed.    Begin home exercise program for neck as directed.   Follow-up information:  Follow up appointment at [facility] in *** .   Call [facility] at [phone] at least 24 hours after completion of Stage 4 with status update.  Patient needs to arrive 30 minutes prior to appointment to complete the following tests: { :28378}.    Patient Education:  Reviewed with patient the risks (i.e, a repeat concussion, post-concussion syndrome, second-impact syndrome) of returning to play prior to complete resolution, and thoroughly reviewed the signs and symptoms of concussion.Reviewed need for complete resolution of all symptoms, with rest AND exertion, prior to return to play.  Reviewed red flags for urgent medical evaluation: worsening symptoms, nausea/vomiting, intractable headache, musculoskeletal changes, focal neurological deficits.  [facility]'s Concussion Care Plan, which clearly outlines the plans stated above, was given to patient.  I was personally involved with the physical evaluation of and am in agreement with the assessment and treatment plan for this patient.  Greater than 50% of this encounter was spent in direct consultation with the patient in  evaluation, counseling, and coordination of care. Duration of encounter: { :28531} minutes.  After Visit Summary printed out and provided to patient as appropriate.  This note is written by Judi SaaZachary M Heywood Tokunaga, in the presence of and acting as the scribe of Judi SaaZachary M Leif Loflin, DO.

## 2017-08-21 ENCOUNTER — Ambulatory Visit: Payer: BLUE CROSS/BLUE SHIELD | Admitting: Family Medicine

## 2017-09-10 ENCOUNTER — Ambulatory Visit: Payer: BLUE CROSS/BLUE SHIELD | Admitting: Family Medicine

## 2018-10-02 ENCOUNTER — Telehealth: Payer: Self-pay | Admitting: General Practice

## 2018-10-02 ENCOUNTER — Telehealth: Payer: Self-pay | Admitting: Family Medicine

## 2018-10-02 NOTE — Telephone Encounter (Signed)
Copied from CRM [id number]. Topic: Quick Communication - Rx Refill/Question >> Oct 02, 2018  1:08 PM Richarda Blade wrote: Medication: Tamiflu   Has the patient contacted their pharmacy? No. (Agent: If no, request that the patient contact the pharmacy for the refill.) Patient got the flu shot last week and now she is experiencing flue symptoms sp she would like medication for it.    Preferred Pharmacy (with phone number or street name):[id number] [place], [place] - [address] AT [facility] [address] [phone] (Phone) [phone] (Fax)     Agent: Please be advised that RX refills may take up to 3 business days. We ask that you follow-up with your pharmacy.

## 2018-10-02 NOTE — Telephone Encounter (Signed)
Copied from CRM #[id number]. Topic: Quick Communication - Rx Refill/Question °>> Oct 02, 2018  1:08 PM Cain, Kristen M wrote: °Medication: Tamiflu  ° °Has the patient contacted their pharmacy? No. °(Agent: If no, request that the patient contact the pharmacy for the refill.) Patient got the flu shot last week and now she is experiencing flue symptoms sp she would like medication for it.  ° ° °Preferred Pharmacy (with phone number or street name):[id number] - [place],  - [address] AT [address] [phone] (Phone) °[phone] (Fax) ° °  ° °Agent: Please be advised that RX refills may take up to 3 business days. We ask that you follow-up with your pharmacy. °

## 2018-10-02 NOTE — Telephone Encounter (Signed)
Pt has not listed PCP and we have not seen pt.

## 2018-10-02 NOTE — Telephone Encounter (Signed)
Copied from CRM #[id number]. Topic: Quick Communication - Rx Refill/Question °>> Oct 02, 2018  1:08 PM Cain, Kristen M wrote: °Medication: Tamiflu  ° °Has the patient contacted their pharmacy? No. °(Agent: If no, request that the patient contact the pharmacy for the refill.) Patient got the flu shot last week and now she is experiencing flue symptoms sp she would like medication for it.  ° ° °Preferred Pharmacy (with phone number or street name):[id number] - [place], [place] - [address] AT [address] [phone] (Phone) °[phone] (Fax) ° °  ° °Agent: Please be advised that RX refills may take up to 3 business days. We ask that you follow-up with your pharmacy. °

## 2018-10-02 NOTE — Telephone Encounter (Signed)
Pt needs an acute care appt for evaluation of the flu

## 2018-10-03 NOTE — Telephone Encounter (Signed)
Called pt and informed her that she would have to schedule an OV for testing and medication, she verbalized understanding.

## 2018-10-06 ENCOUNTER — Telehealth: Payer: Self-pay | Admitting: *Deleted

## 2018-10-06 NOTE — Telephone Encounter (Signed)
Copied from CRM #[id number]. Topic: Quick Communication - Rx Refill/Question °>> Oct 02, 2018  1:08 PM Cain, Kristen M wrote: °Medication: Tamiflu  ° °Has the patient contacted their pharmacy? No. °(Agent: If no, request that the patient contact the pharmacy for the refill.) Patient got the flu shot last week and now she is experiencing flue symptoms sp she would like medication for it.  ° ° °Preferred Pharmacy (with phone number or street name):[id number] - [place], [place] - [address] AT [address] [phone] (Phone) °[phone] (Fax) ° °  ° °Agent: Please be advised that RX refills may take up to 3 business days. We ask that you follow-up with your pharmacy. °

## 2018-10-06 NOTE — Telephone Encounter (Signed)
Copied from CRM #[id number]. Topic: Quick Communication - Rx Refill/Question °>> Oct 02, 2018  1:08 PM Cain, Kristen M wrote: °Medication: Tamiflu  ° °Has the patient contacted their pharmacy? No. °(Agent: If no, request that the patient contact the pharmacy for the refill.) Patient got the flu shot last week and now she is experiencing flue symptoms sp she would like medication for it.  ° ° °Preferred Pharmacy (with phone number or street name):[id number] - [place], [place] - [address] AT [address] [phone] (Phone) °[phone] (Fax) ° °  ° °Agent: Please be advised that RX refills may take up to 3 business days. We ask that you follow-up with your pharmacy. °

## 2019-03-19 ENCOUNTER — Ambulatory Visit (INDEPENDENT_AMBULATORY_CARE_PROVIDER_SITE_OTHER): Payer: BLUE CROSS/BLUE SHIELD | Admitting: Family Medicine

## 2019-03-19 ENCOUNTER — Other Ambulatory Visit: Payer: Self-pay

## 2019-03-19 ENCOUNTER — Encounter: Payer: Self-pay | Admitting: Family Medicine

## 2019-03-19 VITALS — BP 134/81 | HR 69 | Temp 98.5°F | Ht 64.0 in | Wt 136.0 lb

## 2019-03-19 DIAGNOSIS — M549 Dorsalgia, unspecified: Secondary | ICD-10-CM

## 2019-03-19 DIAGNOSIS — R519 Headache, unspecified: Secondary | ICD-10-CM

## 2019-03-19 DIAGNOSIS — R51 Headache: Secondary | ICD-10-CM

## 2019-03-19 DIAGNOSIS — M62838 Other muscle spasm: Secondary | ICD-10-CM

## 2019-03-19 MED ORDER — IBUPROFEN 800 MG PO TABS: 800.0000 mg | ORAL_TABLET | Freq: Three times a day (TID) | ORAL | 0 refills | Status: AC | PRN

## 2019-03-19 MED ORDER — CYCLOBENZAPRINE HCL 10 MG PO TABS: 10.0000 mg | ORAL_TABLET | Freq: Three times a day (TID) | ORAL | 0 refills | Status: AC | PRN

## 2019-03-19 NOTE — Patient Instructions (Signed)
° ° ° °  If you have lab work done today you will be contacted with your lab results within the next 2 weeks.  If you have not heard from us then please contact us. The fastest way to get your results is to register for My Chart. ° ° °IF you received an x-ray today, you will receive an invoice from [facility]. Please contact [facility] at [phone] with questions or concerns regarding your invoice.  ° °IF you received labwork today, you will receive an invoice from [facility]. Please contact [facility] at [phone] with questions or concerns regarding your invoice.  ° °Our billing staff will not be able to assist you with questions regarding bills from these companies. ° °You will be contacted with the lab results as soon as they are available. The fastest way to get your results is to activate your My Chart account. Instructions are located on the last page of this paperwork. If you have not heard from us regarding the results in 2 weeks, please contact this office. °  ° ° ° °
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
## 2019-03-26 ENCOUNTER — Ambulatory Visit (INDEPENDENT_AMBULATORY_CARE_PROVIDER_SITE_OTHER): Payer: BLUE CROSS/BLUE SHIELD | Admitting: Family Medicine

## 2019-03-26 ENCOUNTER — Other Ambulatory Visit: Payer: Self-pay

## 2019-03-26 ENCOUNTER — Encounter: Payer: Self-pay | Admitting: Family Medicine

## 2019-03-26 VITALS — BP 114/77 | HR 90 | Temp 98.8°F | Ht 64.0 in | Wt 137.0 lb

## 2019-03-26 DIAGNOSIS — M62838 Other muscle spasm: Secondary | ICD-10-CM
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
## 2019-03-26 NOTE — Patient Instructions (Signed)
° ° ° °  If you have lab work done today you will be contacted with your lab results within the next 2 weeks.  If you have not heard from us then please contact us. The fastest way to get your results is to register for My Chart. ° ° °IF you received an x-ray today, you will receive an invoice from [facility]. Please contact [facility] at [phone] with questions or concerns regarding your invoice.  ° °IF you received labwork today, you will receive an invoice from [facility]. Please contact [facility] at [phone] with questions or concerns regarding your invoice.  ° °Our billing staff will not be able to assist you with questions regarding bills from these companies. ° °You will be contacted with the lab results as soon as they are available. The fastest way to get your results is to activate your My Chart account. Instructions are located on the last page of this paperwork. If you have not heard from us regarding the results in 2 weeks, please contact this office. °  ° ° ° °

## 2021-06-18 ENCOUNTER — Encounter (HOSPITAL_COMMUNITY): Payer: Self-pay | Admitting: *Deleted

## 2021-06-18 ENCOUNTER — Other Ambulatory Visit: Payer: Self-pay

## 2021-06-18 ENCOUNTER — Emergency Department (HOSPITAL_COMMUNITY): Payer: BLUE CROSS/BLUE SHIELD

## 2021-06-18 ENCOUNTER — Emergency Department (HOSPITAL_COMMUNITY)
Admission: EM | Admit: 2021-06-18 | Discharge: 2021-06-18 | Disposition: A | Discharge status: Home / Self Care | Payer: BLUE CROSS/BLUE SHIELD | Attending: Emergency Medicine | Admitting: Emergency Medicine

## 2021-06-18 DIAGNOSIS — W01198A Fall on same level from slipping, tripping and stumbling with subsequent striking against other object, initial encounter: Secondary | ICD-10-CM | POA: Diagnosis not present

## 2021-06-18 DIAGNOSIS — S0990XA Unspecified injury of head, initial encounter: Secondary | ICD-10-CM

## 2021-06-18 DIAGNOSIS — E876 Hypokalemia: Secondary | ICD-10-CM

## 2021-06-18 DIAGNOSIS — R55 Syncope and collapse: Secondary | ICD-10-CM

## 2021-06-18 DIAGNOSIS — Y92019 Unspecified place in single-family (private) house as the place of occurrence of the external cause: Secondary | ICD-10-CM | POA: Insufficient documentation

## 2021-06-18 HISTORY — DX: Cardiac murmur, unspecified: R01.1

## 2021-06-18 HISTORY — DX: Anemia, unspecified: D64.9

## 2021-06-18 LAB — BASIC METABOLIC PANEL
Anion gap: 8 (ref 5–15)
BUN: 13 mg/dL (ref 6–20)
CO2: 22 mmol/L (ref 22–32)
Calcium: 8.9 mg/dL (ref 8.9–10.3)
Chloride: 106 mmol/L (ref 98–111)
Creatinine, Ser: 0.91 mg/dL (ref 0.44–1.00)
GFR, Estimated: >60 mL/min (ref >60)
Glucose, Bld: 124 mg/dL — ABNORMAL HIGH (ref 70–99)
Potassium: 3.3 mmol/L — ABNORMAL LOW (ref 3.5–5.1)
Sodium: 136 mmol/L (ref 135–145)

## 2021-06-18 LAB — URINALYSIS, MICROSCOPIC (REFLEX)

## 2021-06-18 LAB — URINALYSIS, ROUTINE W REFLEX MICROSCOPIC
Bilirubin Urine: NEGATIVE
Glucose, UA: NEGATIVE mg/dL
Ketones, ur: 15 mg/dL — AB
Leukocytes,Ua: NEGATIVE
Nitrite: NEGATIVE
Protein, ur: 30 mg/dL — AB
Specific Gravity, Urine: 1.025 (ref 1.005–1.030)
pH: 7 (ref 5.0–8.0)

## 2021-06-18 LAB — CBC
HCT: 36.7 % (ref 36.0–46.0)
Hemoglobin: 11.8 g/dL — ABNORMAL LOW (ref 12.0–15.0)
MCH: 27.7 pg (ref 26.0–34.0)
MCHC: 32.2 g/dL (ref 30.0–36.0)
MCV: 86.2 fL (ref 80.0–100.0)
Platelets: 323 10*3/uL (ref 150–400)
RBC: 4.26 MIL/uL (ref 3.87–5.11)
RDW: 12.5 % (ref 11.5–15.5)
WBC: 7.3 10*3/uL (ref 4.0–10.5)
nRBC: 0 % (ref 0.0–0.2)

## 2021-06-18 LAB — POC URINE PREG, ED: Preg Test, Ur: NEGATIVE

## 2021-06-18 NOTE — ED Provider Notes (Addendum)
[facility] EMERGENCY DEPARTMENT Provider Note   CSN: [id number] Arrival date & time: 06/18/21  1132     History Chief Complaint  Patient presents with   Loss of Consciousness    Shelly Cantrell is a 25 y.o. female.  Patient with a syncopal episode fell backwards does not remember falling.  Hit the back of her head.  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  Patient had a mild headache prior no dizziness no nausea or vomiting.  Patient feels fine now.  Other than the back of her head hurting.  History of passing out 1 time before when she was a teenager but none since.  Patient brought in by EMS with a cervical collar in place.      Past Medical History:  Diagnosis Date   Allergy    Anemia    Heart murmur     There are no problems to display for this patient.   History reviewed. No pertinent surgical history.   OB History     Gravida  1   Para      Term      Preterm      AB      Living         SAB      IAB      Ectopic      Multiple      Live Births              Family History  Problem Relation Age of Onset   Cancer Mother    Diabetes Maternal Grandmother    Diabetes Paternal Grandmother     Social History   Tobacco Use   Smoking status: Never   Smokeless tobacco: Never  Vaping Use   Vaping Use: Never used  Substance Use Topics   Alcohol use: No    Alcohol/week: 0.0 standard drinks   Drug use: No    Home Medications Prior to Admission medications   Medication Sig Start Date End Date Taking? Authorizing Provider  Ascorbic Acid (VITAMIN C) 1000 MG tablet Take 2,000 mg by mouth daily.   Yes [provider]  Ferrous Sulfate (IRON) 325 (65 Fe) MG TABS Take 1 tablet by mouth daily.   Yes [provider]  Multiple Vitamin (MULTIVITAMIN) tablet Take 1 tablet by mouth daily.   Yes [provider]  cyclobenzaprine (FLEXERIL) 10 MG tablet Take 1 tablet (10 mg total) by mouth 3  (three) times daily as needed for muscle spasms. Patient not taking: Reported on 06/18/2021 03/19/19   Lezlie Lye, Meda Coffee, MD  ibuprofen (ADVIL) 800 MG tablet Take 1 tablet (800 mg total) by mouth every 8 (eight) hours as needed. Patient not taking: Reported on 06/18/2021 03/19/19   Lezlie Lye, Meda Coffee, MD    Allergies    Penicillins  Review of Systems   Review of Systems  Constitutional:  Negative for chills and fever.  HENT:  Negative for rhinorrhea and sore throat.   Eyes:  Negative for visual disturbance.  Respiratory:  Negative for cough and shortness of breath.   Cardiovascular:  Negative for chest pain and leg swelling.  Gastrointestinal:  Negative for abdominal pain, diarrhea, nausea and vomiting.  Genitourinary:  Negative for dysuria.  Musculoskeletal:  Negative for back pain and neck pain.  Skin:  Negative for rash.  Neurological:  Positive for syncope and headaches. Negative for dizziness and light-headedness.  Hematological:  Does not  bruise/bleed easily.  Psychiatric/Behavioral:  Negative for confusion.    Physical Exam Updated Vital Signs BP 120/72   Pulse 84   Temp 97.8 F (36.6 C) (Oral)   Resp 16   Ht 1.626 m (5\' 4" )   Wt 60.8 kg   LMP 06/17/2021   SpO2 100%   BMI 23.00 kg/m   Physical Exam Vitals and nursing note reviewed.  Constitutional:      General: She is not in acute distress.    Appearance: Normal appearance. She is well-developed. She is not toxic-appearing.  HENT:     Head: Normocephalic and atraumatic.  Eyes:     Extraocular Movements: Extraocular movements intact.     Conjunctiva/sclera: Conjunctivae normal.     Pupils: Pupils are equal, round, and reactive to light.  Neck:     Comments: Cervical collar in place Cardiovascular:     Rate and Rhythm: Normal rate and regular rhythm.     Heart sounds: No murmur heard. Pulmonary:     Effort: Pulmonary effort is normal. No respiratory distress.     Breath sounds: Normal breath sounds.   Abdominal:     Palpations: Abdomen is soft.     Tenderness: There is no abdominal tenderness.  Musculoskeletal:        General: No swelling.  Skin:    General: Skin is warm and dry.     Capillary Refill: Capillary refill takes less than 2 seconds.  Neurological:     General: No focal deficit present.     Mental Status: She is alert and oriented to person, place, and time.     Cranial Nerves: No cranial nerve deficit.     Sensory: No sensory deficit.     Motor: No weakness.  Psychiatric:        Mood and Affect: Mood normal.    ED Results / Procedures / Treatments   Labs (all labs ordered are listed, but only abnormal results are displayed) Labs Reviewed  BASIC METABOLIC PANEL - Abnormal; Notable for the following components:      Result Value   Potassium 3.3 (*)    Glucose, Bld 124 (*)    All other components within normal limits  CBC - Abnormal; Notable for the following components:   Hemoglobin 11.8 (*)    All other components within normal limits  URINALYSIS, ROUTINE W REFLEX MICROSCOPIC - Abnormal; Notable for the following components:   APPearance HAZY (*)    Hgb urine dipstick TRACE (*)    Ketones, ur 15 (*)    Protein, ur 30 (*)    All other components within normal limits  URINALYSIS, MICROSCOPIC (REFLEX) - Abnormal; Notable for the following components:   Bacteria, UA RARE (*)    All other components within normal limits  POC URINE PREG, ED    EKG EKG Interpretation  Date/Time:  Sunday June 18 2021 11:43:40 EST Ventricular Rate:  73 PR Interval:  207 QRS Duration: 85 QT Interval:  385 QTC Calculation: 425 R Axis:   72 Text Interpretation: Sinus arrhythmia Borderline prolonged PR interval Borderline T abnormalities, anterior leads No previous ECGs available Confirmed by Fredia Sorrow [phone]) on 06/18/2021 2:16:52 PM  Radiology No results found.  Procedures Procedures   Medications Ordered in ED Medications - No data to display  ED Course   I have reviewed the triage vital signs and the nursing notes.  Pertinent labs & imaging results that were available during my care of the patient were reviewed by me  and considered in my medical decision making (see chart for details).    MDM Rules/Calculators/A&P                           Work-up for the syncopal episode seems to be fine.  Urinalysis negative pregnancy test negative.  Basic metabolic panel only significant for potassium of 3.3 slightly low but not significant renal functions normal.  No leukocytosis no significant anemia hemoglobin 11.8.  Cardiac monitoring and EKG without any acute changes.  Based on the fall and the fact EMS put hard collar on.  Before I could fully assess her did order head CT CT neck so they are pending.  If those are fine then patient can be discharged home.   Final Clinical Impression(s) / ED Diagnoses Final diagnoses:  Vasovagal syncope  Injury of head, initial encounter  Hypokalemia    Rx / DC Orders ED Discharge Orders     None        Vanetta Mulders, MD 06/18/21 1513    Vanetta Mulders, MD 06/18/21 1515

## 2021-06-18 NOTE — ED Triage Notes (Signed)
Pt brought in by [facility] with c/o syncope today. 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 No dizziness prior to passing out. Pt fell straight back onto the ground.

## 2021-06-18 NOTE — Discharge Instructions (Signed)
Return for any recurrent passing out.  Potassium was a little low but not in the danger zone.  Bananas are high in potassium spinach is high in potassium may want to just diet a little bit.  Recommend follow-up appointment with your primary care doctor.

## 2022-05-31 IMAGING — CT CT HEAD W/O CM
3 series · 15 of 47 positions shown, 18 images · non-contrast
Comparison: None.

CLINICAL DATA: A 25-year-old female presents with syncopal episode
and fall.

EXAM:
CT HEAD WITHOUT CONTRAST
CT CERVICAL SPINE WITHOUT CONTRAST
TECHNIQUE: Multidetector CT imaging of the head and cervical spine was
performed following the standard protocol without intravenous
contrast. Multiplanar CT image reconstructions of the cervical spine
were also generated.

[Series 3: head w o · axial · 0.42mm/px · z∈[+26,+161]mm · 9 of 33 slices shown, 12 images]
[im 3/33  brain]
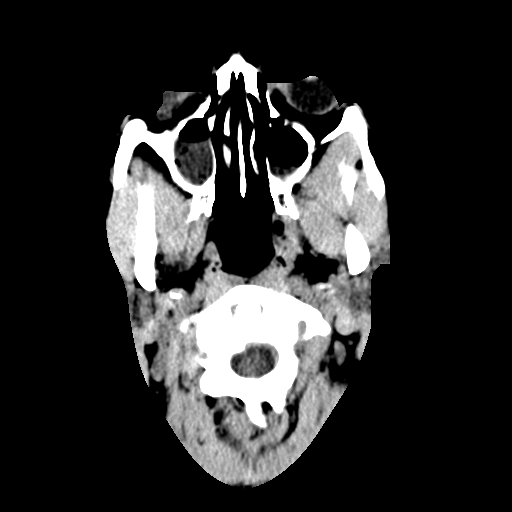
[im 3/33  bone]
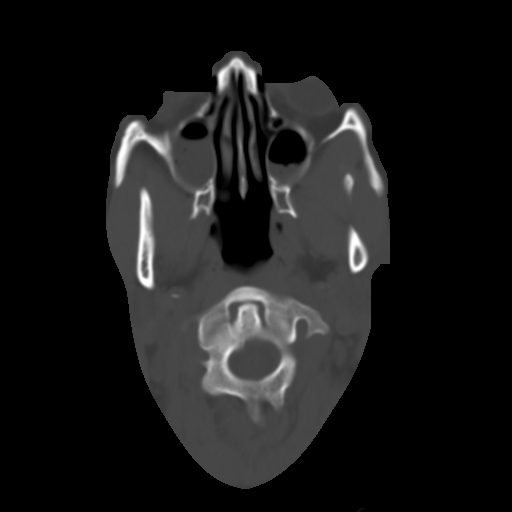
[im 6/33  brain]
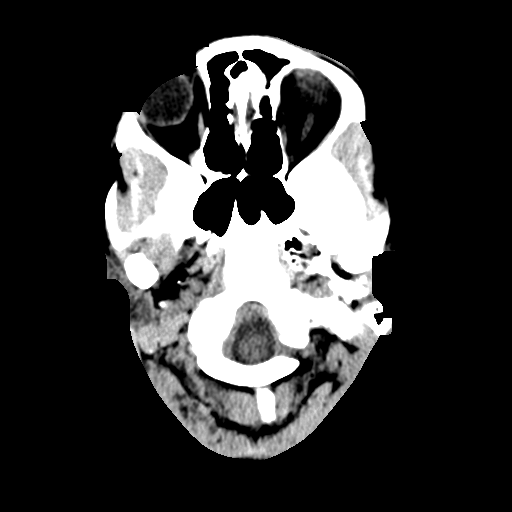
[im 9/33  brain]
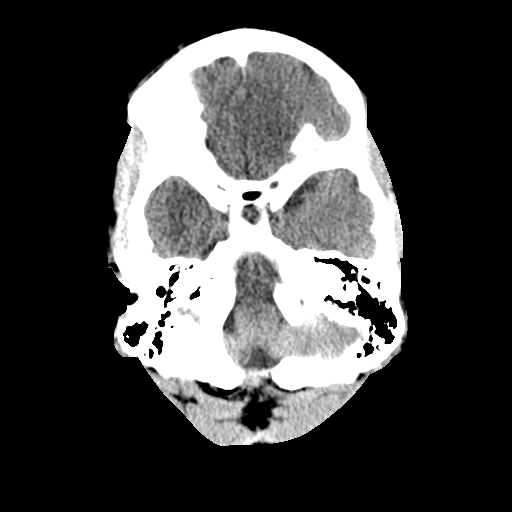
[im 13/33  brain]
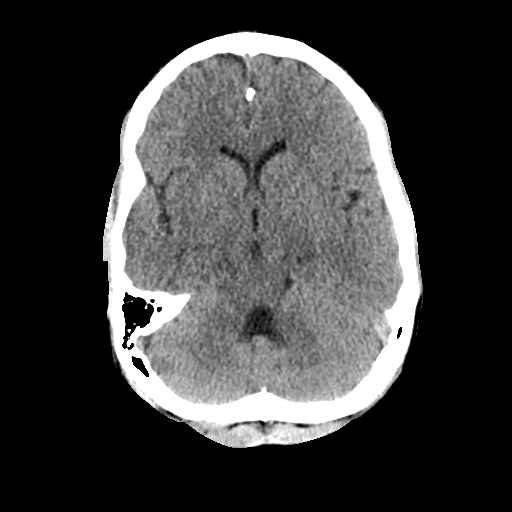
[im 17/33  brain]
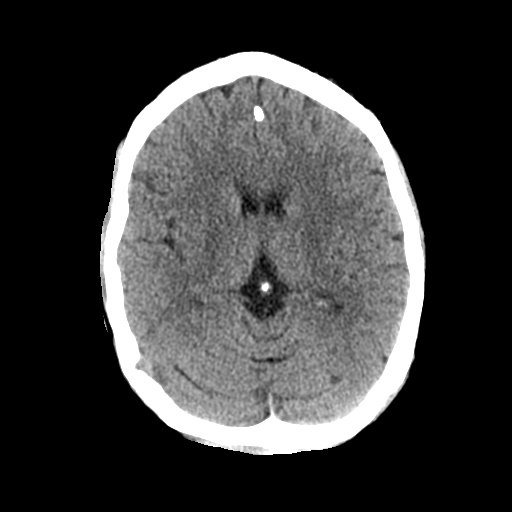
[im 17/33  bone]
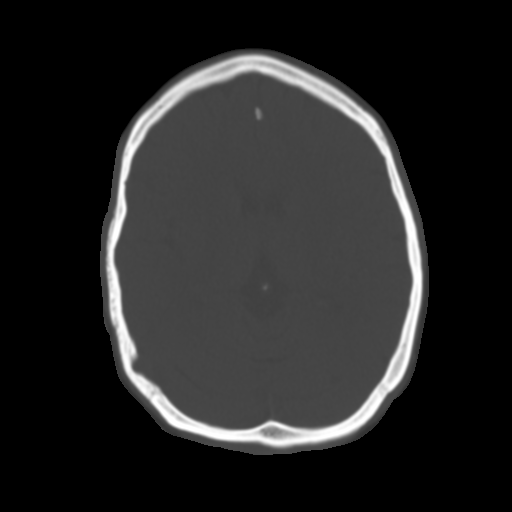
[im 20/33  brain]
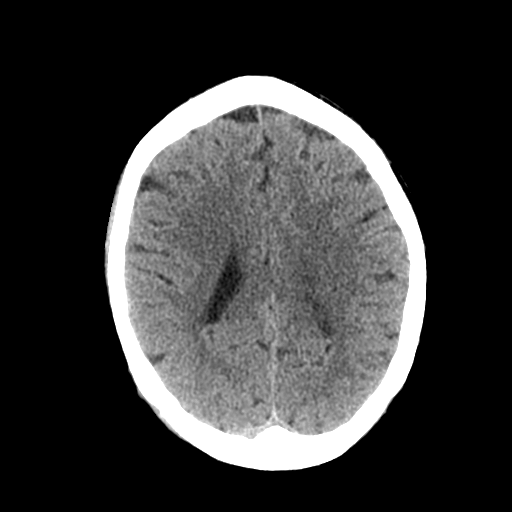
[im 24/33  brain]
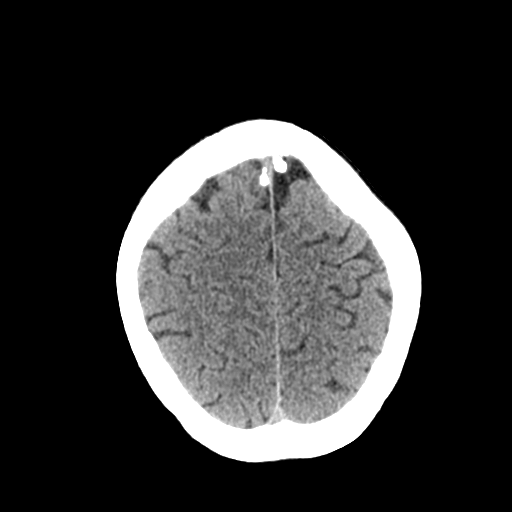
[im 27/33  brain]
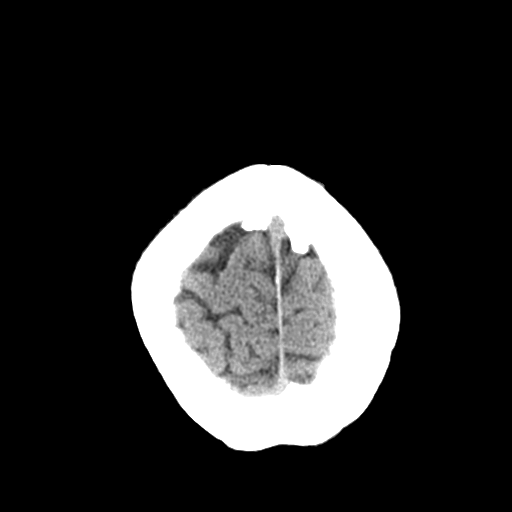
[im 30/33  brain]
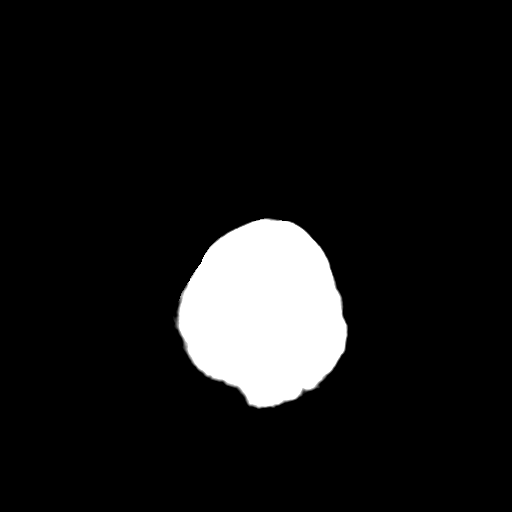
[im 30/33  bone]
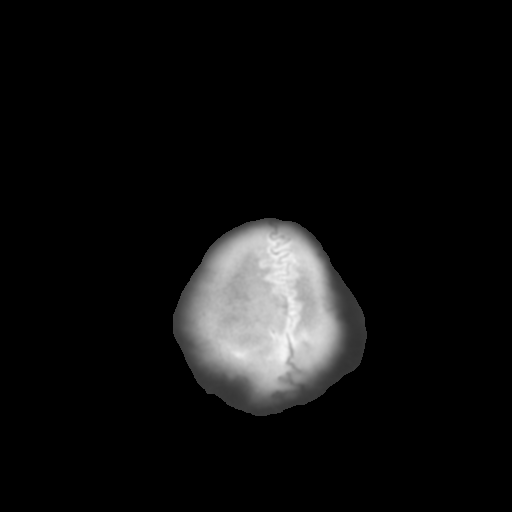

[Series 4: coronal soft · coronal · 0.30mm/px · 3 of 76 slices shown]
[im 26/76  brain]
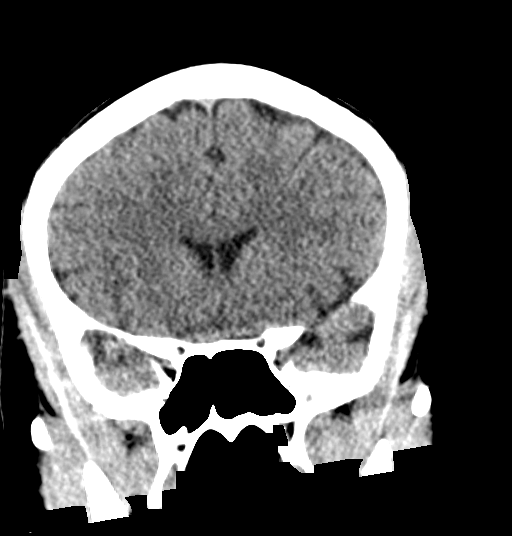
[im 34/76  brain]
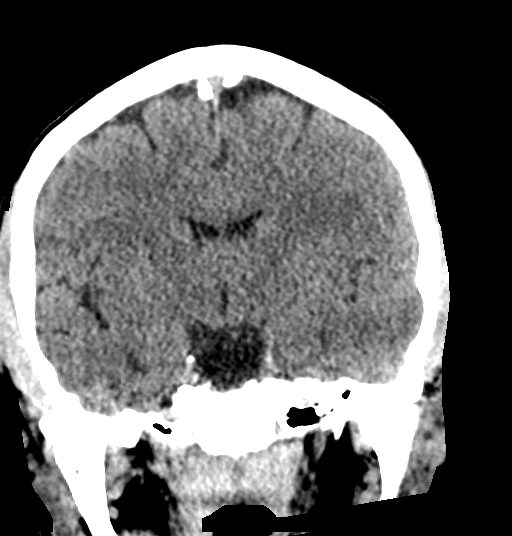
[im 42/76  brain]
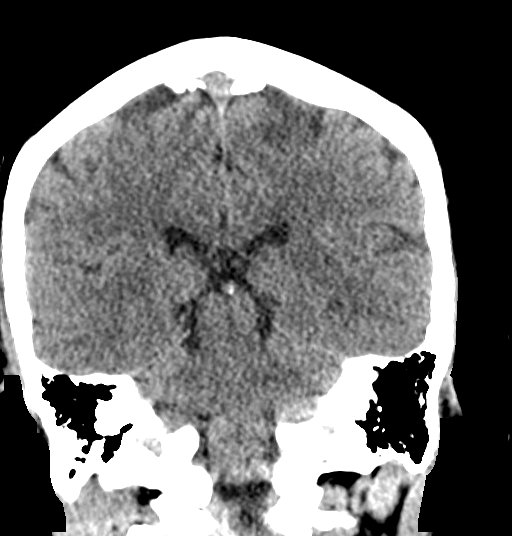

[Series 5: sagittal soft · sagittal · 0.35mm/px · 3 of 55 slices shown]
[im 19/55  brain]
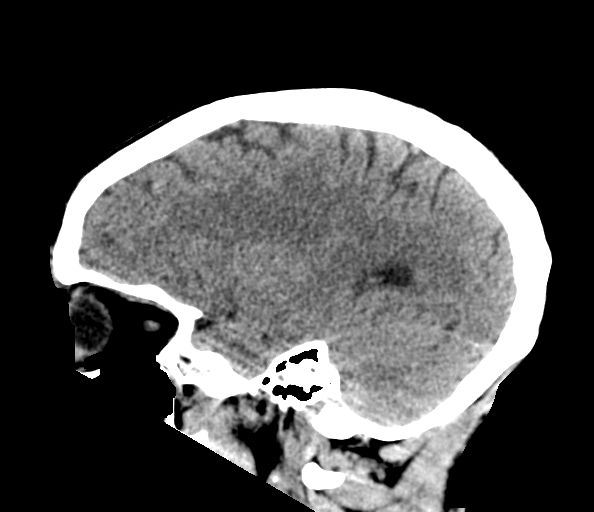
[im 28/55  brain]
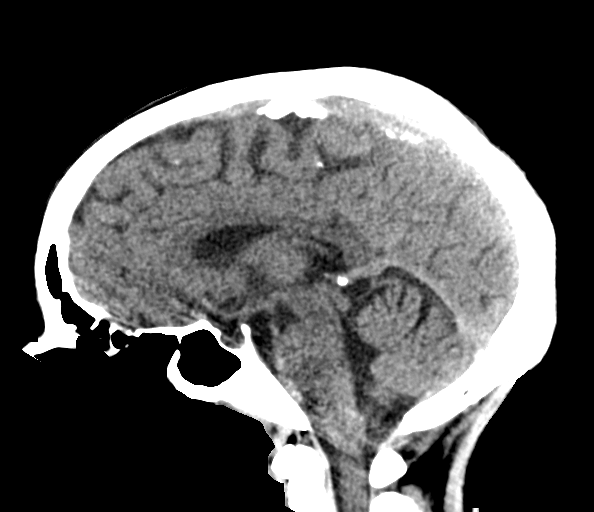
[im 37/55  brain]
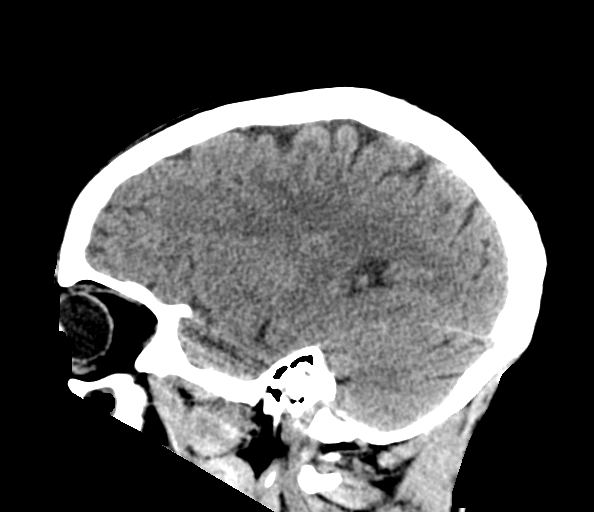

[15 of 47 positions shown; findings below may reference images not displayed]

FINDINGS: CT HEAD FINDINGS

Brain: No evidence of acute infarction, hemorrhage, hydrocephalus,
extra-axial collection or mass lesion/mass effect.

Vascular: No hyperdense vessel or unexpected calcification.

Skull: Normal. Negative for fracture or focal lesion.

Sinuses/Orbits: Frothy secretions in the bilateral maxillary
sinuses. Sinuses as visualized, incompletely visualized are
otherwise unremarkable as are the orbits.

Other: None

CT CERVICAL SPINE FINDINGS

Alignment: Straightening of normal cervical lordotic curvature and
mild reversal of normal cervical lordotic curvature likely due to
patient position or spasm

Skull base and vertebrae: No acute fracture. No primary bone lesion
or focal pathologic process.

Soft tissues and spinal canal: No prevertebral fluid or swelling. No
visible canal hematoma.

Disc levels:  No substantial disc space narrowing.

Upper chest: Negative.

Other: None
IMPRESSION: No acute intracranial abnormality.

No evidence of acute traumatic injury to the cervical spine.

Frothy secretions in the bilateral maxillary sinuses. Correlate for
signs of acute sinusitis.
